# Patient Record
Sex: Female | Born: 1945 | Race: White | Hispanic: No | Smoking: Current every day smoker
Health system: Southern US, Community
[De-identification: ages and names within clinical notes are randomized; demographics above are authoritative.]

## PROBLEM LIST (undated history)

## (undated) DIAGNOSIS — I10 Essential (primary) hypertension: Secondary | ICD-10-CM

---

## 2017-05-13 ENCOUNTER — Encounter (HOSPITAL_BASED_OUTPATIENT_CLINIC_OR_DEPARTMENT_OTHER): Payer: Self-pay | Admitting: Emergency Medicine

## 2017-05-13 ENCOUNTER — Other Ambulatory Visit: Payer: Self-pay

## 2017-05-13 ENCOUNTER — Emergency Department (HOSPITAL_BASED_OUTPATIENT_CLINIC_OR_DEPARTMENT_OTHER): Payer: Medicare Other

## 2017-05-13 ENCOUNTER — Emergency Department (HOSPITAL_BASED_OUTPATIENT_CLINIC_OR_DEPARTMENT_OTHER)
Admission: EM | Admit: 2017-05-13 | Discharge: 2017-05-13 | Disposition: A | Discharge status: Home / Self Care | Payer: Medicare Other | Attending: Emergency Medicine | Admitting: Emergency Medicine

## 2017-05-13 DIAGNOSIS — F17200 Nicotine dependence, unspecified, uncomplicated: Secondary | ICD-10-CM | POA: Diagnosis not present

## 2017-05-13 DIAGNOSIS — I10 Essential (primary) hypertension: Secondary | ICD-10-CM | POA: Insufficient documentation

## 2017-05-13 DIAGNOSIS — Z79899 Other long term (current) drug therapy: Secondary | ICD-10-CM | POA: Diagnosis not present

## 2017-05-13 DIAGNOSIS — R1031 Right lower quadrant pain: Secondary | ICD-10-CM | POA: Diagnosis present

## 2017-05-13 DIAGNOSIS — Z7902 Long term (current) use of antithrombotics/antiplatelets: Secondary | ICD-10-CM | POA: Insufficient documentation

## 2017-05-13 HISTORY — DX: Essential (primary) hypertension: I10

## 2017-05-13 LAB — URINALYSIS, ROUTINE W REFLEX MICROSCOPIC
BILIRUBIN URINE: NEGATIVE
GLUCOSE, UA: NEGATIVE mg/dL
HGB URINE DIPSTICK: NEGATIVE
Ketones, ur: NEGATIVE mg/dL
Nitrite: NEGATIVE
PH: 7 (ref 5.0–8.0)
Protein, ur: NEGATIVE mg/dL
SPECIFIC GRAVITY, URINE: 1.01 (ref 1.005–1.030)

## 2017-05-13 LAB — COMPREHENSIVE METABOLIC PANEL
ALT: 12 U/L — ABNORMAL LOW (ref 14–54)
AST: 19 U/L (ref 15–41)
Albumin: 4.1 g/dL (ref 3.5–5.0)
Alkaline Phosphatase: 50 U/L (ref 38–126)
Anion gap: 10 (ref 5–15)
BILIRUBIN TOTAL: 0.6 mg/dL (ref 0.3–1.2)
BUN: 16 mg/dL (ref 6–20)
CALCIUM: 9.1 mg/dL (ref 8.9–10.3)
CHLORIDE: 98 mmol/L — AB (ref 101–111)
CO2: 23 mmol/L (ref 22–32)
CREATININE: 1.02 mg/dL — AB (ref 0.44–1.00)
GFR calc Af Amer: >60 mL/min (ref >60)
GFR, EST NON AFRICAN AMERICAN: 54 mL/min — AB (ref >60)
GLUCOSE: 271 mg/dL — AB (ref 65–99)
POTASSIUM: 3.2 mmol/L — AB (ref 3.5–5.1)
Sodium: 131 mmol/L — ABNORMAL LOW (ref 135–145)
Total Protein: 6.4 g/dL — ABNORMAL LOW (ref 6.5–8.1)

## 2017-05-13 LAB — CBC
HCT: 35.1 % — ABNORMAL LOW (ref 36.0–46.0)
HEMOGLOBIN: 12 g/dL (ref 12.0–15.0)
MCH: 30.8 pg (ref 26.0–34.0)
MCHC: 34.2 g/dL (ref 30.0–36.0)
MCV: 90 fL (ref 78.0–100.0)
Platelets: 318 10*3/uL (ref 150–400)
RBC: 3.9 MIL/uL (ref 3.87–5.11)
RDW: 12.5 % (ref 11.5–15.5)
WBC: 10 10*3/uL (ref 4.0–10.5)

## 2017-05-13 LAB — URINALYSIS, MICROSCOPIC (REFLEX)

## 2017-05-13 LAB — LIPASE, BLOOD: Lipase: 33 U/L (ref 11–51)

## 2017-05-13 MED ORDER — IOPAMIDOL (ISOVUE-300) INJECTION 61%
100.0000 mL | Freq: Once | INTRAVENOUS | Status: AC | PRN
  Administered 2017-05-13: 100 mL via INTRAVENOUS

## 2017-05-13 NOTE — ED Provider Notes (Signed)
[facility] EMERGENCY DEPARTMENT Provider Note   CSN: [id number] Arrival date & time: 05/13/17  1347     History   Chief Complaint Chief Complaint  Patient presents with  . Abdominal Pain    HPI Brittany Dominguez is a 72 y.o. female.  The history is provided by the patient. No language interpreter was used.  Abdominal Pain     Brittany Dominguez is a 72 y.o. female who presents to the Emergency Department complaining of abdominal pain.  She reports right lower quadrant abdominal pain that began about a week and a half ago.  Pain is constant in nature and gradual onset.  It is worse with activity.  She denies any injuries, fevers, vomiting, diarrhea, constipation, dysuria.  She does have mild nausea that is been present for the last day.  No prior similar symptoms.  She has a history of hypertension and diabetes.  She has a history of hysterectomy.  She is not sure if she has had her gallbladder or appendix out.  She recently moved to the area from [place] and has not yet established a family doctor. Past Medical History:  Diagnosis Date  . Hypertension     There are no active problems to display for this patient.   History reviewed. No pertinent surgical history.  OB History    No data available       Home Medications    Prior to Admission medications   Medication Sig Start Date End Date Taking? Authorizing Provider  amLODipine (NORVASC) 10 MG tablet Take 10 mg by mouth daily.   Yes [provider]  chlorthalidone (HYGROTON) 25 MG tablet Take 25 mg by mouth daily.   Yes [provider]  clopidogrel (PLAVIX) 75 MG tablet Take 75 mg by mouth daily.   Yes [provider]  hydrALAZINE (APRESOLINE) 50 MG tablet Take 50 mg by mouth 3 (three) times daily.   Yes [provider]  irbesartan (AVAPRO) 75 MG tablet Take 75 mg by mouth daily.   Yes [provider]  labetalol (NORMODYNE) 100 MG tablet Take 100 mg by mouth 2 (two) times  daily.   Yes [provider]  pantoprazole (PROTONIX) 40 MG tablet Take 40 mg by mouth daily.   Yes [provider]    Family History No family history on file.  Social History Social History   Tobacco Use  . Smoking status: Current Every Day Smoker  . Smokeless tobacco: Never Used  Substance Use Topics  . Alcohol use: Not on file  . Drug use: Not on file     Allergies   Keflex [cephalexin] and Penicillins   Review of Systems Review of Systems  Gastrointestinal: Positive for abdominal pain.  All other systems reviewed and are negative.    Physical Exam Updated Vital Signs BP (!) 140/56 (BP Location: Left Arm)   Pulse 74   Temp 98 F (36.7 C) (Oral)   Resp 18   Ht 5\' 1"  (1.549 m)   Wt 64.4 kg (142 lb)   SpO2 96%   BMI 26.83 kg/m   Physical Exam  Constitutional: She is oriented to person, place, and time. She appears well-developed and well-nourished.  HENT:  Head: Normocephalic and atraumatic.  Cardiovascular: Normal rate and regular rhythm.  No murmur heard. Pulmonary/Chest: Effort normal and breath sounds normal. No respiratory distress.  Abdominal: Soft.  Mild to moderate lower abdominal tenderness without any guarding or rebound.  No inguinal hernias or masses.  Musculoskeletal:  She exhibits no edema or tenderness.  Neurological: She is alert and oriented to person, place, and time.  Skin: Skin is warm and dry.  Psychiatric: She has a normal mood and affect. Her behavior is normal.  Nursing note and vitals reviewed.    ED Treatments / Results  Labs (all labs ordered are listed, but only abnormal results are displayed) Labs Reviewed  COMPREHENSIVE METABOLIC PANEL - Abnormal; Notable for the following components:      Result Value   Sodium 131 (*)    Potassium 3.2 (*)    Chloride 98 (*)    Glucose, Bld 271 (*)    Creatinine, Ser 1.02 (*)    Total Protein 6.4 (*)    ALT 12 (*)    GFR calc non Af Amer 54 (*)    All other  components within normal limits  CBC - Abnormal; Notable for the following components:   HCT 35.1 (*)    All other components within normal limits  URINALYSIS, ROUTINE W REFLEX MICROSCOPIC - Abnormal; Notable for the following components:   Leukocytes, UA TRACE (*)    All other components within normal limits  URINALYSIS, MICROSCOPIC (REFLEX) - Abnormal; Notable for the following components:   Bacteria, UA MANY (*)    Squamous Epithelial / LPF 6-30 (*)    All other components within normal limits  URINE CULTURE  LIPASE, BLOOD    EKG  EKG Interpretation None       Radiology Ct Abdomen Pelvis W Contrast  Result Date: 05/13/2017 CLINICAL DATA:  Right-sided abdominal pain radiating to the back. EXAM: CT ABDOMEN AND PELVIS WITH CONTRAST TECHNIQUE: Multidetector CT imaging of the abdomen and pelvis was performed using the standard protocol following bolus administration of intravenous contrast. CONTRAST:  100mL ISOVUE-300 IOPAMIDOL (ISOVUE-300) INJECTION 61% COMPARISON:  None. FINDINGS: Lower chest: 4 mm right lower lobe pulmonary nodule seen on image 16 series 4. Coronary artery calcification is evident. Hepatobiliary: No focal abnormality within the liver parenchyma. There is no evidence for gallstones, gallbladder wall thickening, or pericholecystic fluid. No intrahepatic or extrahepatic biliary dilation. Pancreas: Dystrophic calcification is identified in the tail of pancreas with diffuse pancreatic parenchymal atrophy. No dilatation of the main pancreatic duct. Spleen: No splenomegaly. No focal mass lesion. Adrenals/Urinary Tract: 10 mm left adrenal nodule cannot be definitively characterize. Right adrenal gland unremarkable. 3.2 cm cyst identified right kidney. Tiny hypoattenuating lesions in each kidney are too small to characterize but likely cysts. No evidence for hydroureter. The urinary bladder appears normal for the degree of distention. Stomach/Bowel: Stomach is nondistended. No  gastric wall thickening. No evidence of outlet obstruction. Duodenum is normally positioned as is the ligament of Treitz. No small bowel wall thickening. No small bowel dilatation. The terminal ileum is normal. The appendix is not visualized, but there is no edema or inflammation in the region of the cecum. Diverticular changes are noted in the left colon without evidence of diverticulitis. Vascular/Lymphatic: There is abdominal aortic atherosclerosis without aneurysm. There is no gastrohepatic or hepatoduodenal ligament lymphadenopathy. No intraperitoneal or retroperitoneal lymphadenopathy. No pelvic sidewall lymphadenopathy. Reproductive: Uterus surgically absent.  There is no adnexal mass. Other: No intraperitoneal free fluid. Musculoskeletal: Bone windows reveal no worrisome lytic or sclerotic osseous lesions. IMPRESSION: 1. No acute findings in the abdomen or pelvis. 2. Atrophy with dystrophic calcification in the tail the pancreas, likely related to chronic pancreatitis. 3. 10 mm left adrenal nodule cannot be definitively characterized. While likely benign, follow-up adrenal protocol CT in  12 months recommended. This recommendation follows ACR consensus guidelines: Management of Incidental Adrenal Masses: A White Paper of the ACR Incidental Findings Committee. J Am Coll Radiol 2017;14:1038-1044. 4. 4 mm right lower lobe pulmonary nodule. No follow-up needed if patient is low-risk. Non-contrast chest CT can be considered in 12 months if patient is high-risk. This recommendation follows the consensus statement: Guidelines for Management of Incidental Pulmonary Nodules Detected on CT Images: From the Fleischner Society 2017; Radiology 2017; 284:228-243. Electronically Signed   By: Kennith Center M.D.   On: 05/13/2017 16:59    Procedures Procedures (including critical care time)  Medications Ordered in ED Medications  iopamidol (ISOVUE-300) 61 % injection 100 mL (100 mLs Intravenous Contrast Given 05/13/17  1623)     Initial Impression / Assessment and Plan / ED Course  I have reviewed the triage vital signs and the nursing notes.  Pertinent labs & imaging results that were available during my care of the patient were reviewed by me and considered in my medical decision making (see chart for details).     Patient here for evaluation of lower abdominal pain that is worse with movement and activity.  She does have tenderness on examination with no peritoneal findings.  CT abdomen is negative for acute process that could be contributing to her symptoms.  She does not have any urinary symptoms.  UA does have bacteria as well as squamous epithelials, no additional evidence of infection, will send culture and only treat if positive.  Discussed with patient unclear etiology of her symptoms.  Discussed close outpatient follow-up as well as return precautions.  Recommend Tylenol at home for pain.  Final Clinical Impressions(s) / ED Diagnoses   Final diagnoses:  Right lower quadrant abdominal pain    ED Discharge Orders    None       Tilden Fossa, MD 05/13/17 2353

## 2017-05-13 NOTE — ED Triage Notes (Signed)
RLQ pain radiating to back x 1 week with nausea. Denies urinary symptoms. [facility] sent for further eval.

## 2017-05-13 NOTE — Discharge Instructions (Signed)
You can take tylenol, available over the counter according to label instructions as needed for abdominal pain.  He had a CT scan performed today that showed some nodules that need to be rechecked by your family doctor.  Drink plenty of fluids.  You were mildly dehydrated on your emergency department visit today.

## 2017-05-15 LAB — URINE CULTURE: Culture: NO GROWTH

## 2017-07-12 ENCOUNTER — Other Ambulatory Visit: Payer: Self-pay | Admitting: Family Medicine

## 2017-07-12 DIAGNOSIS — R0989 Other specified symptoms and signs involving the circulatory and respiratory systems: Secondary | ICD-10-CM

## 2017-07-16 ENCOUNTER — Other Ambulatory Visit: Payer: Self-pay | Admitting: Family Medicine

## 2017-07-16 DIAGNOSIS — M5441 Lumbago with sciatica, right side: Secondary | ICD-10-CM

## 2017-07-19 ENCOUNTER — Ambulatory Visit
Admission: RE | Admit: 2017-07-19 | Discharge: 2017-07-19 | Disposition: A | Discharge status: Home / Self Care | Payer: Medicare Other | Source: Ambulatory Visit | Attending: Family Medicine | Admitting: Family Medicine

## 2017-07-19 DIAGNOSIS — R0989 Other specified symptoms and signs involving the circulatory and respiratory systems: Secondary | ICD-10-CM

## 2017-07-27 ENCOUNTER — Ambulatory Visit
Admission: RE | Admit: 2017-07-27 | Discharge: 2017-07-27 | Disposition: A | Discharge status: Home / Self Care | Payer: Medicare Other | Source: Ambulatory Visit | Attending: Family Medicine | Admitting: Family Medicine

## 2017-07-27 DIAGNOSIS — M5441 Lumbago with sciatica, right side: Secondary | ICD-10-CM

## 2017-07-27 MED ORDER — GADOBENATE DIMEGLUMINE 529 MG/ML IV SOLN
13.0000 mL | Freq: Once | INTRAVENOUS | Status: AC | PRN
  Administered 2017-07-27: 13 mL via INTRAVENOUS

## 2018-11-24 IMAGING — CT CT ABD-PELV W/ CM
2 of 5 series · 15 of 46 positions shown, 17 images · IV contrast (APPLIED)
Comparison: None.

CLINICAL DATA: Right-sided abdominal pain radiating to the back.

EXAM:
CT ABDOMEN AND PELVIS WITH CONTRAST
TECHNIQUE: Multidetector CT imaging of the abdomen and pelvis was performed
using the standard protocol following bolus administration of
intravenous contrast.
CONTRAST:  100mL LSKZEF-IQQ IOPAMIDOL (LSKZEF-IQQ) INJECTION 61%

[Series 2: axial st · axial · 0.79mm/px · z∈[-434,+21]mm · 12 of 103 slices shown, 14 images]
[im 6/103  soft-tissue]
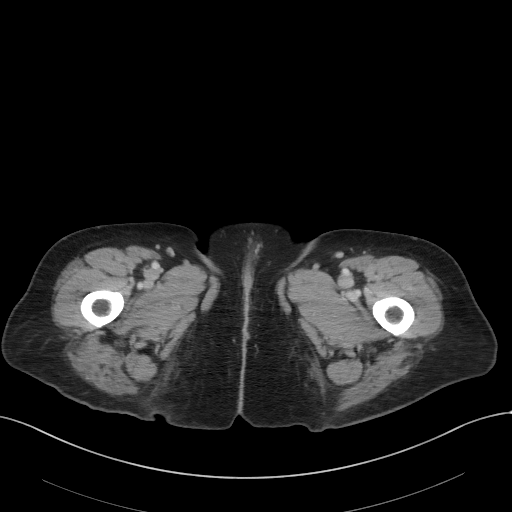
[im 6/103  bone]
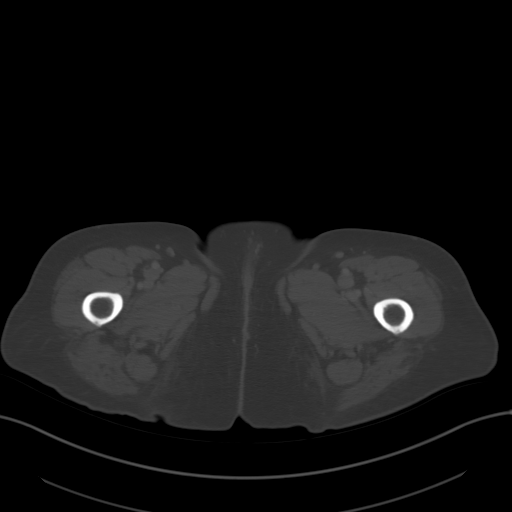
[im 18/103  soft-tissue]
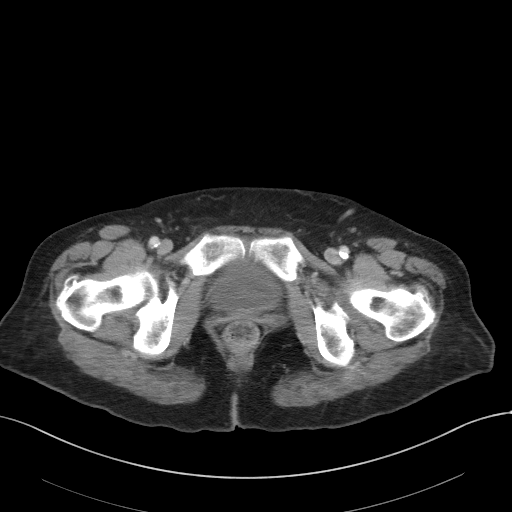
[im 23/103  soft-tissue]
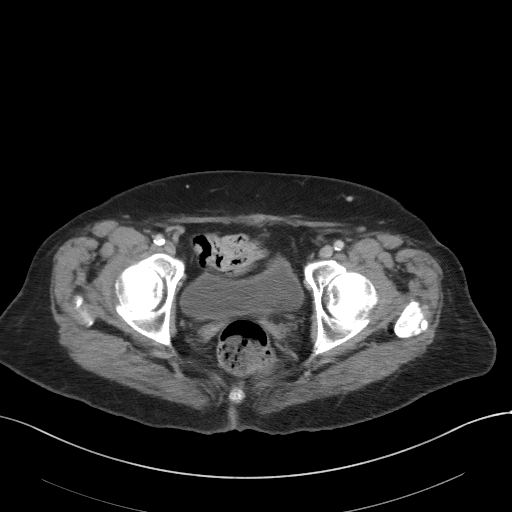
[im 29/103  soft-tissue]
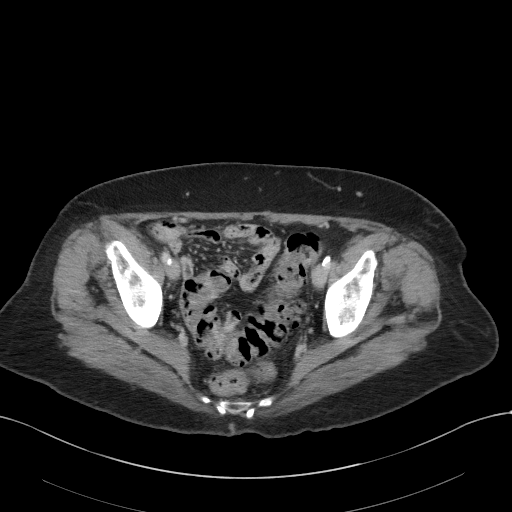
[im 40/103  soft-tissue]
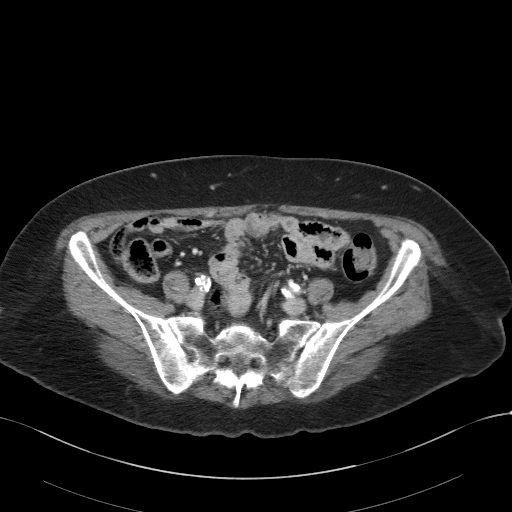
[im 46/103  soft-tissue]
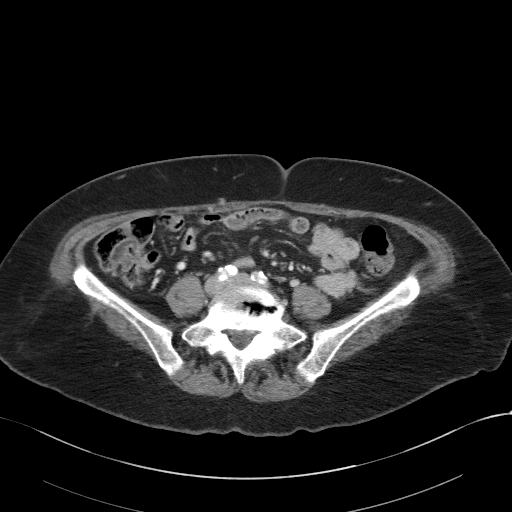
[im 57/103  soft-tissue]
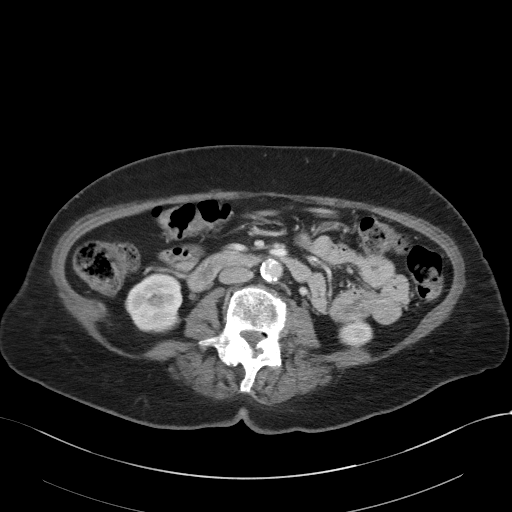
[im 63/103  soft-tissue]
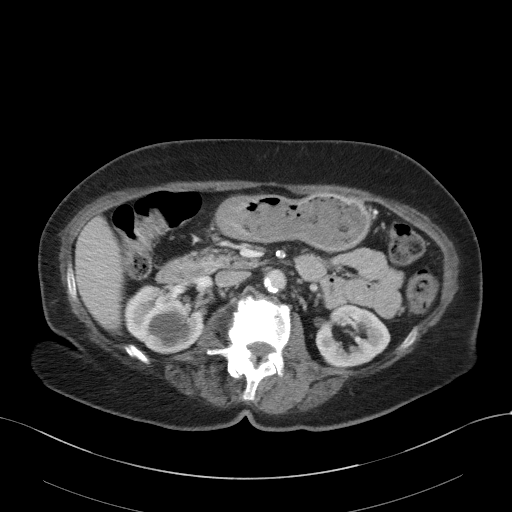
[im 74/103  soft-tissue]
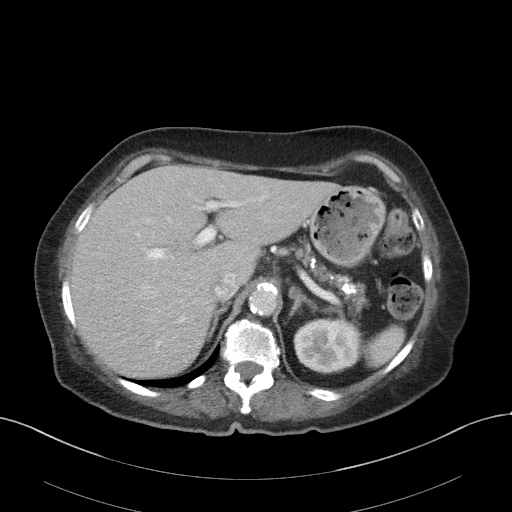
[im 74/103  bone]
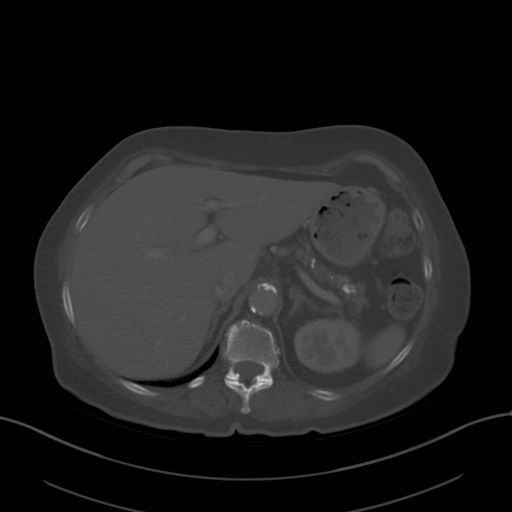
[im 80/103  soft-tissue]
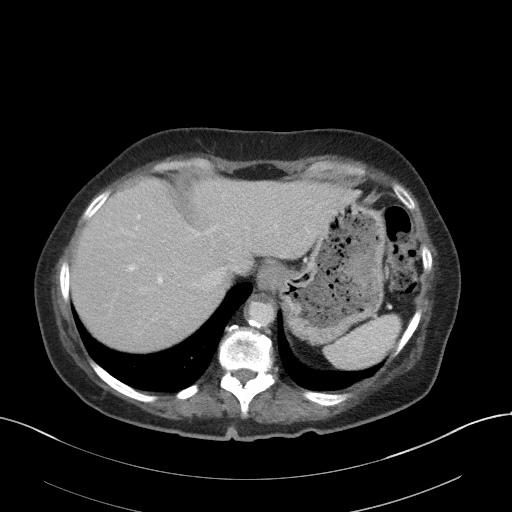
[im 86/103  soft-tissue]
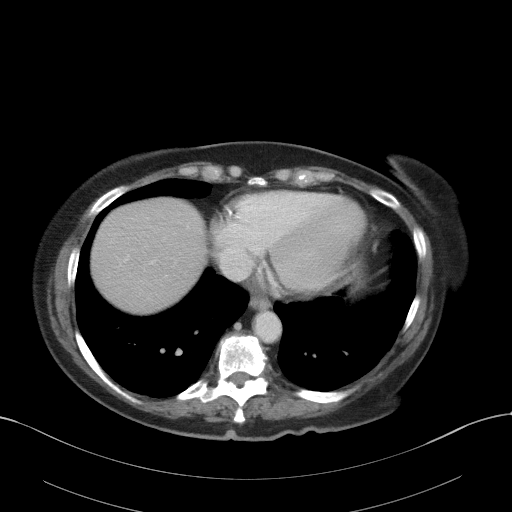
[im 97/103  soft-tissue]
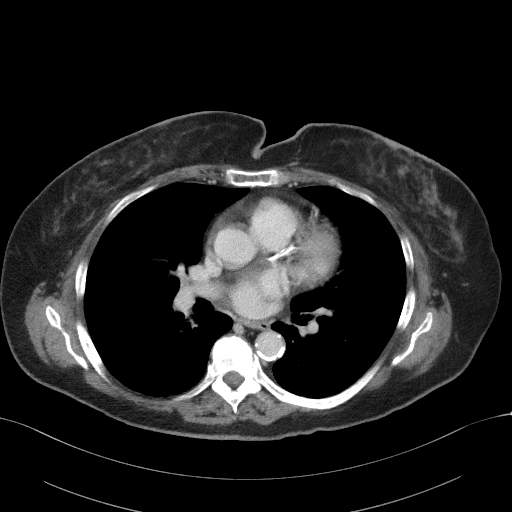

[Series 5: coronal st · coronal · 0.85mm/px · 3 of 78 slices shown]
[im 26/78  soft-tissue]
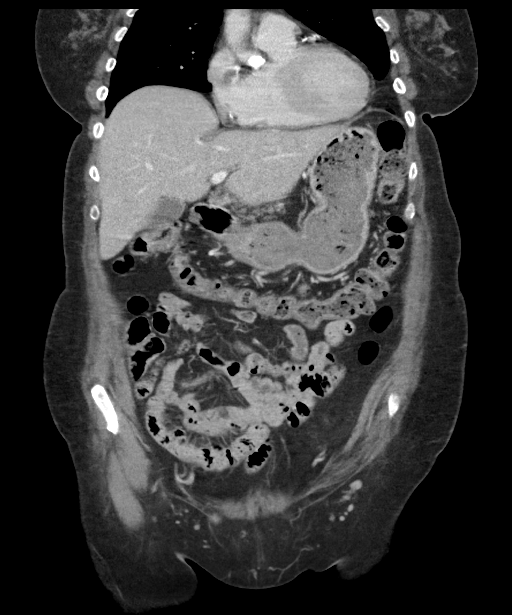
[im 35/78  soft-tissue]
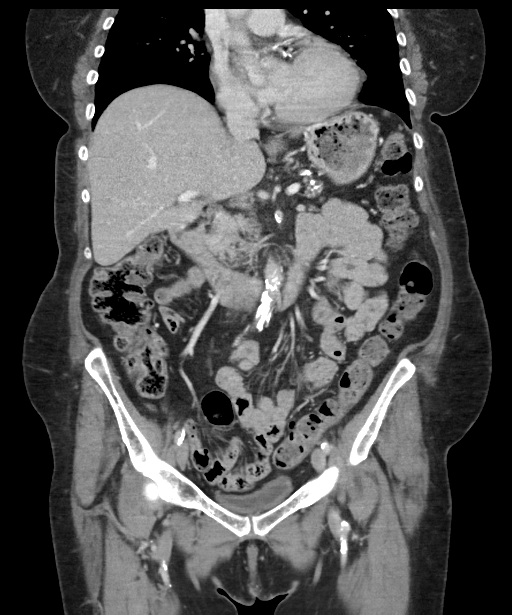
[im 43/78  soft-tissue]
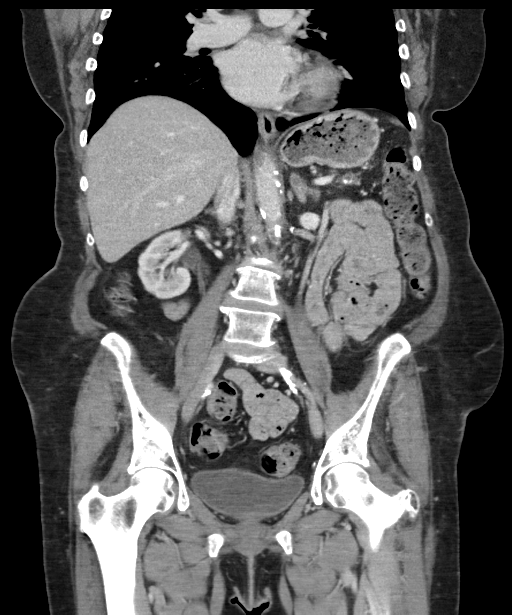

[15 of 46 positions shown; findings below may reference images not displayed]

FINDINGS: Lower chest: 4 mm right lower lobe pulmonary nodule seen on image 16
series 4. Coronary artery calcification is evident.

Hepatobiliary: No focal abnormality within the liver parenchyma.
There is no evidence for gallstones, gallbladder wall thickening, or
pericholecystic fluid. No intrahepatic or extrahepatic biliary
dilation.

Pancreas: Dystrophic calcification is identified in the tail of
pancreas with diffuse pancreatic parenchymal atrophy. No dilatation
of the main pancreatic duct.

Spleen: No splenomegaly. No focal mass lesion.

Adrenals/Urinary Tract: 10 mm left adrenal nodule cannot be
definitively characterize. Right adrenal gland unremarkable. 3.2 cm
cyst identified right kidney. Tiny hypoattenuating lesions in each
kidney are too small to characterize but likely cysts. No evidence
for hydroureter. The urinary bladder appears normal for the degree
of distention.

Stomach/Bowel: Stomach is nondistended. No gastric wall thickening.
No evidence of outlet obstruction. Duodenum is normally positioned
as is the ligament of Treitz. No small bowel wall thickening. No
small bowel dilatation. The terminal ileum is normal. The appendix
is not visualized, but there is no edema or inflammation in the
region of the cecum. Diverticular changes are noted in the left
colon without evidence of diverticulitis.

Vascular/Lymphatic: There is abdominal aortic atherosclerosis
without aneurysm. There is no gastrohepatic or hepatoduodenal
ligament lymphadenopathy. No intraperitoneal or retroperitoneal
lymphadenopathy. No pelvic sidewall lymphadenopathy.

Reproductive: Uterus surgically absent.  There is no adnexal mass.

Other: No intraperitoneal free fluid.

Musculoskeletal: Bone windows reveal no worrisome lytic or sclerotic
osseous lesions.
IMPRESSION: 1. No acute findings in the abdomen or pelvis.
2. Atrophy with dystrophic calcification in the tail the pancreas,
likely related to chronic pancreatitis.
3. 10 mm left adrenal nodule cannot be definitively characterized.
While likely benign, follow-up adrenal protocol CT in 12 months
recommended. This recommendation follows ACR consensus guidelines:
Management of Incidental Adrenal Masses: A White Paper of the ACR
Incidental Findings Committee. [HOSPITAL] 6246;14:7197-7100.
4. 4 mm right lower lobe pulmonary nodule. No follow-up needed if
patient is low-risk. Non-contrast chest CT can be considered in 12
months if patient is high-risk. This recommendation follows the
consensus statement: Guidelines for Management of Incidental
Pulmonary Nodules Detected on CT Images: From the [HOSPITAL]

## 2020-03-08 DEATH — deceased
